# Patient Record
Sex: Female | Born: 1965
Health system: Southern US, Community
[De-identification: ages and names within clinical notes are randomized; demographics above are authoritative.]

## PROBLEM LIST (undated history)

## (undated) DIAGNOSIS — R519 Headache, unspecified: Secondary | ICD-10-CM

## (undated) DIAGNOSIS — M797 Fibromyalgia: Secondary | ICD-10-CM

## (undated) DIAGNOSIS — D219 Benign neoplasm of connective and other soft tissue, unspecified: Secondary | ICD-10-CM

## (undated) DIAGNOSIS — R51 Headache: Secondary | ICD-10-CM

## (undated) HISTORY — PX: ABDOMINAL HYSTERECTOMY: SHX81

---

## 2009-07-03 ENCOUNTER — Emergency Department (HOSPITAL_BASED_OUTPATIENT_CLINIC_OR_DEPARTMENT_OTHER): Admission: EM | Admit: 2009-07-03 | Discharge: 2009-07-04 | Payer: Self-pay | Admitting: Emergency Medicine

## 2011-03-14 LAB — HEMOGLOBIN A1C
Hgb A1c MFr Bld: 5.6 % (ref 4.6–6.1)
Mean Plasma Glucose: 114 mg/dL

## 2015-12-08 ENCOUNTER — Emergency Department (HOSPITAL_BASED_OUTPATIENT_CLINIC_OR_DEPARTMENT_OTHER)
Admission: EM | Admit: 2015-12-08 | Discharge: 2015-12-08 | Disposition: A | Discharge status: Home / Self Care | Payer: BLUE CROSS/BLUE SHIELD | Attending: Emergency Medicine | Admitting: Emergency Medicine

## 2015-12-08 ENCOUNTER — Encounter (HOSPITAL_BASED_OUTPATIENT_CLINIC_OR_DEPARTMENT_OTHER): Payer: Self-pay | Admitting: *Deleted

## 2015-12-08 DIAGNOSIS — Z23 Encounter for immunization: Secondary | ICD-10-CM | POA: Diagnosis not present

## 2015-12-08 DIAGNOSIS — L02211 Cutaneous abscess of abdominal wall: Secondary | ICD-10-CM | POA: Diagnosis present

## 2015-12-08 DIAGNOSIS — L0291 Cutaneous abscess, unspecified: Secondary | ICD-10-CM

## 2015-12-08 DIAGNOSIS — M797 Fibromyalgia: Secondary | ICD-10-CM | POA: Diagnosis not present

## 2015-12-08 DIAGNOSIS — Z86018 Personal history of other benign neoplasm: Secondary | ICD-10-CM | POA: Insufficient documentation

## 2015-12-08 HISTORY — DX: Fibromyalgia: M79.7

## 2015-12-08 HISTORY — DX: Benign neoplasm of connective and other soft tissue, unspecified: D21.9

## 2015-12-08 MED ORDER — LIDOCAINE HCL (PF) 1 % IJ SOLN
5.0000 mL | Freq: Once | INTRAMUSCULAR | Status: AC
  Administered 2015-12-08: 5 mL
  Filled 2015-12-08: qty 5

## 2015-12-08 MED ORDER — SULFAMETHOXAZOLE-TRIMETHOPRIM 800-160 MG PO TABS: 1.0000 | ORAL_TABLET | Freq: Two times a day (BID) | ORAL | Status: AC

## 2015-12-08 MED ORDER — TETANUS-DIPHTH-ACELL PERTUSSIS 5-2.5-18.5 LF-MCG/0.5 IM SUSP
0.5000 mL | Freq: Once | INTRAMUSCULAR | Status: AC
  Administered 2015-12-08: 0.5 mL via INTRAMUSCULAR
  Filled 2015-12-08: qty 0.5

## 2015-12-08 MED ORDER — HYDROCODONE-ACETAMINOPHEN 5-325 MG PO TABS: 1.0000 | ORAL_TABLET | ORAL | Status: DC | PRN

## 2015-12-08 MED FILL — HYDROCODON-APAP 5-325: 5-325 | 2 days supply | Qty: 15 | Fill #0

## 2015-12-08 MED FILL — SULFAMETHOXAZOLE/TMP DS TAB: 800-160 | 10 days supply | Qty: 20 | Fill #0

## 2015-12-08 NOTE — Discharge Instructions (Signed)

## 2015-12-08 NOTE — ED Notes (Signed)
C/o abscess to right breast started several months ago. Feels hot and painful.

## 2015-12-08 NOTE — ED Notes (Signed)
Wound cleaned with NS and 4x4 drsg applied secured with paper tape.

## 2015-12-08 NOTE — ED Provider Notes (Addendum)
CSN: [id number]     Arrival date & time 12/08/15  0800 History   First MD Initiated Contact with Patient 12/08/15 0815     Chief Complaint  Patient presents with  . Abscess     (Consider location/radiation/quality/duration/timing/severity/associated sxs/prior Treatment) HPI Comments: Patient presents with a knot to her upper abdominal wall. She states she noted a small bump that was non-tender about 2 months ago. It's been unchanged since that time. About a week ago she noticed it was getting bigger and more painful. There is no drainage. She denies any fevers. She denies any history of skin abscesses. She has a constant throbbing pain to the area.  Patient is a 50 y.o. female presenting with abscess.  Abscess Associated symptoms: no fever, no headaches, no nausea and no vomiting     Past Medical History  Diagnosis Date  . Fibromyalgia   . Fibroid tumor    Past Surgical History  Procedure Laterality Date  . Abdominal hysterectomy     No family history on file. Social History  Substance Use Topics  . Smoking status: Never Smoker   . Smokeless tobacco: None  . Alcohol Use: None   OB History    No data available     Review of Systems  Constitutional: Negative for fever.  Gastrointestinal: Negative for nausea and vomiting.  Musculoskeletal: Negative for back pain, joint swelling, arthralgias and neck pain.  Skin: Positive for wound.  Neurological: Negative for weakness, numbness and headaches.      Allergies  Review of patient's allergies indicates no known allergies.  Home Medications   Prior to Admission medications   Medication Sig Start Date End Date Taking? Authorizing Provider  amitriptyline (ELAVIL) 10 MG tablet Take 10 mg by mouth at bedtime.   Yes Historical Provider, MD  HYDROcodone-acetaminophen (NORCO/VICODIN) 5-325 MG tablet Take 1-2 tablets by mouth every 4 (four) hours as needed. 12/08/15   Malvin Johns, MD  sulfamethoxazole-trimethoprim (BACTRIM  DS,SEPTRA DS) 800-160 MG tablet Take 1 tablet by mouth 2 (two) times daily. 12/08/15 12/15/15  Malvin Johns, MD   BP 151/91 mmHg  Pulse 74  Temp(Src) 98.8 F (37.1 C) (Oral)  Resp 18  Ht 5\' 7"  (1.702 m)  SpO2 98% Physical Exam  Constitutional: She is oriented to person, place, and time. She appears well-developed and well-nourished.  HENT:  Head: Normocephalic and atraumatic.  Neck: Normal range of motion. Neck supple.  Cardiovascular: Normal rate.   Pulmonary/Chest: Effort normal.  Musculoskeletal: She exhibits edema and tenderness.  Patient has a 2 cm indurated area to her right upper abdominal wall. There is surrounding erythema about 3-4 cm in diameter. No drainage. It is tender to palpation. There seems to be a small central area of fluctuance.  Neurological: She is alert and oriented to person, place, and time.  Skin: Skin is warm and dry.  Psychiatric: She has a normal mood and affect.    ED Course  .Marland KitchenIncision and Drainage Date/Time: 12/08/2015 8:54 AM Performed by: Fatimah Sundquist Authorized by: Malvin Johns Consent: Verbal consent obtained. Risks and benefits: risks, benefits and alternatives were discussed Consent given by: patient Type: abscess Body area: trunk Location details: abdomen Anesthesia: local infiltration Local anesthetic: lidocaine 1% without epinephrine Anesthetic total: 3 ml Patient sedated: no Scalpel size: 11 Incision type: elliptical Incision depth: subcutaneous Complexity: simple Drainage: purulent Drainage amount: moderate Wound treatment: wound left open Patient tolerance: Patient tolerated the procedure well with no immediate complications Comments: Moderate amount of cheesy discharge from  wound   (including critical care time) Labs Review Labs Reviewed - No data to display  Imaging Review No results found. I have personally reviewed and evaluated these images and lab results as part of my medical decision-making.   EKG  Interpretation None      MDM   Final diagnoses:  Abscess    Patient's abscess was opened. Her tetanus shot was updated. She was given a prescription for Vicodin and Bactrim. I feel that this was an infected sebaceous cyst. I advised her that the cyst may recur and gave her referral to [facility] surgery for definitive removal should it recur.  Pt's BP was a little high.  Advised her that she needs to have this rechecked.    Malvin Johns, MD 12/08/15 [id number]  Malvin Johns, MD 12/08/15 [phone]

## 2016-12-15 ENCOUNTER — Emergency Department (HOSPITAL_BASED_OUTPATIENT_CLINIC_OR_DEPARTMENT_OTHER)
Admission: EM | Admit: 2016-12-15 | Discharge: 2016-12-15 | Disposition: A | Discharge status: Home / Self Care | Payer: Self-pay | Attending: Emergency Medicine | Admitting: Emergency Medicine

## 2016-12-15 ENCOUNTER — Encounter (HOSPITAL_BASED_OUTPATIENT_CLINIC_OR_DEPARTMENT_OTHER): Payer: Self-pay

## 2016-12-15 ENCOUNTER — Emergency Department (HOSPITAL_BASED_OUTPATIENT_CLINIC_OR_DEPARTMENT_OTHER): Payer: Self-pay

## 2016-12-15 DIAGNOSIS — J069 Acute upper respiratory infection, unspecified: Secondary | ICD-10-CM | POA: Insufficient documentation

## 2016-12-15 MED ORDER — BENZONATATE 100 MG PO CAPS: 100.0000 mg | ORAL_CAPSULE | Freq: Three times a day (TID) | ORAL | 0 refills | Status: DC

## 2016-12-15 MED ORDER — IBUPROFEN 800 MG PO TABS: 800.0000 mg | ORAL_TABLET | Freq: Three times a day (TID) | ORAL | 0 refills | Status: AC

## 2016-12-15 MED ORDER — IBUPROFEN 800 MG PO TABS
800.0000 mg | ORAL_TABLET | Freq: Once | ORAL | Status: AC
  Administered 2016-12-15: 800 mg via ORAL
  Filled 2016-12-15: qty 1

## 2016-12-15 NOTE — ED Triage Notes (Signed)
C/o body aches, prod cough, sore throat x 5 days-NAD-steady gait-last dose tylenol 4pm

## 2016-12-15 NOTE — Discharge Instructions (Signed)
There were no abnormalities on the chest x-ray. Your symptoms are consistent with a viral illness. Viruses do not require antibiotics. Treatment is symptomatic care and it is important to note that these symptoms may last for 7-10 days. Drink plenty of fluids and get plenty of rest. You should be drinking at least half a liter of water an hour to stay hydrated. Electrolyte drinks are also encouraged. Ibuprofen, Naproxen, or Tylenol for pain or fever. Tessalon for cough. Plain Mucinex may help relieve congestion. Warm liquids or Chloraseptic spray may help soothe a sore throat. Follow up with a primary care provider, as needed, for any future management of this issue.

## 2016-12-15 NOTE — ED Provider Notes (Signed)
[place] [facility] Provider Note   CSN: [id number] Arrival date & time: 12/15/16  [id number]  By signing my name below, I, Kathy Berger, attest that this documentation has been prepared under the direction and in the presence of non-physician practitioner, Arlean Hopping, PA-C. Electronically Signed: Jeanell Berger, Scribe. 12/15/2016. 9:10 PM.  History   Chief Complaint Chief Complaint  Patient presents with  . Generalized Body Aches   The history is provided by the patient. No language interpreter was used.   HPI Comments: Kathy Berger is a 51 y.o. female who presents to the Emergency Department complaining of intermittent moderate productive cough that started about 4 days ago. She reports associated generalized myalgias and subjective fever. She took tylenol for symptoms with some relief. She denies N/V/D, chest pain, shortness of breath, or any other complaints.    Past Medical History:  Diagnosis Date  . Fibroid tumor   . Fibromyalgia     There are no active problems to display for this patient.   Past Surgical History:  Procedure Laterality Date  . ABDOMINAL HYSTERECTOMY      OB History    No data available       Home Medications    Prior to Admission medications   Medication Sig Start Date End Date Taking? Authorizing Provider  benzonatate (TESSALON) 100 MG capsule Take 1 capsule (100 mg total) by mouth every 8 (eight) hours. 12/15/16   Warren Kugelman C Feras Gardella, PA-C  ibuprofen (ADVIL,MOTRIN) 800 MG tablet Take 1 tablet (800 mg total) by mouth 3 (three) times daily. 12/15/16   Lorayne Bender, PA-C    Family History No family history on file.  Social History Social History  Substance Use Topics  . Smoking status: Never Smoker  . Smokeless tobacco: Never Used  . Alcohol use No     Allergies   Patient has no known allergies.   Review of Systems Review of Systems  Constitutional: Positive for fever (Subjective).  Respiratory: Positive for cough. Negative for shortness of  breath.   Cardiovascular: Negative for chest pain.  Gastrointestinal: Negative for abdominal pain, diarrhea, nausea and vomiting.  Skin: Negative for rash.  All other systems reviewed and are negative.    Physical Exam Updated Vital Signs BP 142/80 (BP Location: Left Arm)   Pulse 75   Temp 98.9 F (37.2 C) (Oral)   Resp 20   SpO2 100%   Physical Exam  Constitutional: She appears well-developed and well-nourished. No distress.  HENT:  Head: Normocephalic and atraumatic.  Eyes: Conjunctivae are normal.  Neck: Neck supple.  Cardiovascular: Normal rate, regular rhythm, normal heart sounds and intact distal pulses.   Pulmonary/Chest: Effort normal and breath sounds normal. No respiratory distress.  Abdominal: Soft. There is no tenderness. There is no guarding.  Musculoskeletal: She exhibits no edema.  Lymphadenopathy:    She has no cervical adenopathy.  Neurological: She is alert.  Skin: Skin is warm and dry. She is not diaphoretic.  Psychiatric: She has a normal mood and affect. Her behavior is normal.  Nursing note and vitals reviewed.    ED Treatments / Results  DIAGNOSTIC STUDIES: Oxygen Saturation is 100% on RA, normal by my interpretation.    COORDINATION OF CARE: 9:14 PM- Pt advised of plan for treatment and pt agrees.  Labs (all labs ordered are listed, but only abnormal results are displayed) Labs Reviewed - No data to display  EKG  EKG Interpretation None       Radiology EXAM:  CHEST  2 VIEW    COMPARISON: None.    FINDINGS:  Shallow inspiration with mild linear atelectasis in the lung bases.  Normal heart size and pulmonary vascularity. No focal airspace  disease or consolidation in the lungs. No blunting of costophrenic  angles. No pneumothorax. Mediastinal contours appear intact.    IMPRESSION:  Shallow inspiration with mild linear atelectasis in the lung bases.  No focal consolidation.      Electronically Signed  By: Lucienne Capers M.D.  On: 12/15/2016 21:33     Procedures Procedures (including critical care time)  Medications Ordered in ED Medications  ibuprofen (ADVIL,MOTRIN) tablet 800 mg (800 mg Oral Given 12/15/16 1905)     Initial Impression / Assessment and Plan / ED Course  I have reviewed the triage vital signs and the nursing notes.  Pertinent labs & imaging results that were available during my care of the patient were reviewed by me and considered in my medical decision making (see chart for details).  Clinical Course     Patient presents with cough and URI symptoms. Negative chest x-ray. Home care and return precautions discussed.  Vitals:   12/15/16 1900 12/15/16 2015 12/15/16 2103  BP: 142/80  123/76  Pulse: 89  75  Resp: 20  18  Temp: 101.1 F (38.4 C) 100.1 F (37.8 C) 98.9 F (37.2 C)  TempSrc: Oral  Oral  SpO2: 100%  99%     Final Clinical Impressions(s) / ED Diagnoses   Final diagnoses:  Upper respiratory tract infection, unspecified type    New Prescriptions Discharge Medication List as of 12/15/2016  9:15 PM    START taking these medications   Details  benzonatate (TESSALON) 100 MG capsule Take 1 capsule (100 mg total) by mouth every 8 (eight) hours., Starting Wed 12/15/2016, Print    ibuprofen (ADVIL,MOTRIN) 800 MG tablet Take 1 tablet (800 mg total) by mouth 3 (three) times daily., Starting Wed 12/15/2016, Print       I personally performed the services described in this documentation, which was scribed in my presence. The recorded information has been reviewed and is accurate.    Lorayne Bender, PA-C 12/18/16 [id number]    Merrily Pew, MD 12/20/16 [phone]

## 2016-12-15 NOTE — ED Notes (Signed)
Patient transported to X-ray 

## 2017-09-02 ENCOUNTER — Telehealth: Payer: Self-pay | Admitting: *Deleted

## 2017-09-02 NOTE — Telephone Encounter (Signed)
Received request for Medical records from [place], forwarded to [place] for email/scan/SLS 09/28

## 2018-01-17 ENCOUNTER — Other Ambulatory Visit: Payer: Self-pay

## 2018-01-17 ENCOUNTER — Emergency Department (HOSPITAL_BASED_OUTPATIENT_CLINIC_OR_DEPARTMENT_OTHER)
Admission: EM | Admit: 2018-01-17 | Discharge: 2018-01-17 | Disposition: A | Discharge status: Home / Self Care | Payer: BLUE CROSS/BLUE SHIELD | Attending: Emergency Medicine | Admitting: Emergency Medicine

## 2018-01-17 ENCOUNTER — Encounter (HOSPITAL_BASED_OUTPATIENT_CLINIC_OR_DEPARTMENT_OTHER): Payer: Self-pay | Admitting: Emergency Medicine

## 2018-01-17 DIAGNOSIS — R51 Headache: Secondary | ICD-10-CM | POA: Insufficient documentation

## 2018-01-17 DIAGNOSIS — I10 Essential (primary) hypertension: Secondary | ICD-10-CM | POA: Insufficient documentation

## 2018-01-17 DIAGNOSIS — R519 Headache, unspecified: Secondary | ICD-10-CM

## 2018-01-17 HISTORY — DX: Headache: R51

## 2018-01-17 HISTORY — DX: Headache, unspecified: R51.9

## 2018-01-17 MED ORDER — IBUPROFEN 400 MG PO TABS
600.0000 mg | ORAL_TABLET | Freq: Once | ORAL | Status: AC
  Administered 2018-01-17: 600 mg via ORAL
  Filled 2018-01-17: qty 1

## 2018-01-17 NOTE — ED Triage Notes (Signed)
Pt has headache for 3 days.  Pt states she has a lump on the back of her head since childhood and believes it is related.  Pt also states she has generalized body aches.  No fever.  No nausea.  No photo sensitivity.

## 2018-01-17 NOTE — Discharge Instructions (Signed)
If you develop new or worsening symptoms such as a more severe headache, vomiting, blurry vision, or dizziness then return to the ER for evaluation.  Otherwise follow-up with your primary care doctor for these on and off headaches.  Your blood pressure was elevated today and you need to follow-up with your primary care doctor to get this rechecked.

## 2018-01-17 NOTE — ED Provider Notes (Signed)
[name]   CSN: [id number] Arrival date & time: 01/17/18  0700     History   Chief Complaint Chief Complaint  Patient presents with  . Headache    HPI Kathy Berger is a 52 y.o. female.  HPI 52 year old female with a history of fibromyalgia and a history of recurrent left-sided headaches presents with a left-sided headache.  Started on 2/9.  Has been coming and going since. Patient states that the headache feels like it starts behind her left ear and radiates to the front.  Also radiates into her neck.  No blurry vision, photophobia, neck stiffness, vomiting, or weakness/numbness.  She took some Motrin yesterday but otherwise has not taken much.  Since today has also been feeling diffuse pain throughout her body consistent with prior flares from fibromyalgia.  She has not had any fevers during this time.  No sore throat or other pain.  She states she was diagnosed with a lump behind her left ear when she was a child and was told to watch it but otherwise she has had around that area.  No ear pain.  Past Medical History:  Diagnosis Date  . Fibroid tumor   . Fibromyalgia   . Headache     There are no active problems to display for this patient.   Past Surgical History:  Procedure Laterality Date  . ABDOMINAL HYSTERECTOMY      OB History    No data available       Home Medications    Prior to Admission medications   Medication Sig Start Date End Date Taking? Authorizing Provider  ibuprofen (ADVIL,MOTRIN) 800 MG tablet Take 1 tablet (800 mg total) by mouth 3 (three) times daily. 12/15/16   Joy, Helane Gunther, PA-C    Family History No family history on file.  Social History Social History   Tobacco Use  . Smoking status: Never Smoker  . Smokeless tobacco: Never Used  Substance Use Topics  . Alcohol use: No  . Drug use: No     Allergies   Patient has no known allergies.   Review of Systems Review of Systems    Constitutional: Negative for fever.  HENT: Negative for ear pain.   Eyes: Negative for photophobia and visual disturbance.  Gastrointestinal: Negative for nausea and vomiting.  Musculoskeletal: Negative for neck stiffness.  Neurological: Positive for headaches. Negative for dizziness, weakness and numbness.  All other systems reviewed and are negative.    Physical Exam Updated Vital Signs BP (!) 164/94 (BP Location: Left Arm)   Pulse 69   Temp 99.1 F (37.3 C) (Oral)   Resp 16   Ht 5' 7.5" (1.715 m)   Wt 93.8 kg (206 lb 12.7 oz)   SpO2 97%   BMI 31.91 kg/m   Physical Exam  Constitutional: She is oriented to person, place, and time. She appears well-developed and well-nourished.  Non-toxic appearance. She does not appear ill. No distress.  HENT:  Head: Normocephalic and atraumatic.  Right Ear: Tympanic membrane, external ear and ear canal normal. No tenderness.  Left Ear: Tympanic membrane, external ear and ear canal normal. No tenderness. No mastoid tenderness.  Nose: Nose normal.  Mouth/Throat: Oropharynx is clear and moist.  No tenderness to scalp. No swelling or skin changes  Eyes: EOM are normal. Pupils are equal, round, and reactive to light. Right eye exhibits no discharge. Left eye exhibits no discharge.  Neck: Normal range of motion. Neck supple. No neck  rigidity.  Cardiovascular: Normal rate, regular rhythm and normal heart sounds.  Pulmonary/Chest: Effort normal and breath sounds normal.  Abdominal: She exhibits no distension.  Neurological: She is alert and oriented to person, place, and time.  CN 3-12 grossly intact. 5/5 strength in all 4 extremities. Grossly normal sensation. Normal finger to nose.   Skin: Skin is warm and dry.  Nursing note and vitals reviewed.    ED Treatments / Results  Labs (all labs ordered are listed, but only abnormal results are displayed) Labs Reviewed - No data to display  EKG  EKG Interpretation None        Radiology No results found.  Procedures Procedures (including critical care time)  Medications Ordered in ED Medications  ibuprofen (ADVIL,MOTRIN) tablet 600 mg (600 mg Oral Given 01/17/18 0729)     Initial Impression / Assessment and Plan / ED Course  I have reviewed the triage vital signs and the nursing notes.  Pertinent labs & imaging results that were available during my care of the patient were reviewed by me and considered in my medical decision making (see chart for details).     Patient is hypertensive here, unclear if this is pathologic or from pain.  However she does not appear in much distress.  Offered IM medication for her headache but she declines.  Her headache is likely benign, especially with no other concerning findings or symptoms such as fevers, vomiting, photophobia/blurry vision or weakness/numbness.  She overall appears well.  Advised NSAIDs and follow-up with PCP.  No indication for emergent imaging at this time.  My suspicion for serious CNS pathology such as infection, bleed, stroke, etc. is low.  Final Clinical Impressions(s) / ED Diagnoses   Final diagnoses:  Left-sided headache  Hypertension, unspecified type    ED Discharge Orders    None       Sherwood Gambler, MD 01/17/18 [phone]

## 2019-02-17 ENCOUNTER — Emergency Department (HOSPITAL_BASED_OUTPATIENT_CLINIC_OR_DEPARTMENT_OTHER): Payer: BLUE CROSS/BLUE SHIELD

## 2019-02-17 ENCOUNTER — Emergency Department (HOSPITAL_BASED_OUTPATIENT_CLINIC_OR_DEPARTMENT_OTHER)
Admission: EM | Admit: 2019-02-17 | Discharge: 2019-02-17 | Disposition: A | Discharge status: Home / Self Care | Payer: BLUE CROSS/BLUE SHIELD | Attending: Emergency Medicine | Admitting: Emergency Medicine

## 2019-02-17 ENCOUNTER — Other Ambulatory Visit: Payer: Self-pay

## 2019-02-17 DIAGNOSIS — M25512 Pain in left shoulder: Secondary | ICD-10-CM | POA: Insufficient documentation

## 2019-02-17 DIAGNOSIS — R0789 Other chest pain: Secondary | ICD-10-CM | POA: Insufficient documentation

## 2019-02-17 DIAGNOSIS — Z79899 Other long term (current) drug therapy: Secondary | ICD-10-CM | POA: Insufficient documentation

## 2019-02-17 LAB — BASIC METABOLIC PANEL
Anion gap: 5 (ref 5–15)
BUN: 10 mg/dL (ref 6–20)
CO2: 26 mmol/L (ref 22–32)
Calcium: 8.9 mg/dL (ref 8.9–10.3)
Chloride: 106 mmol/L (ref 98–111)
Creatinine, Ser: 0.79 mg/dL (ref 0.44–1.00)
GFR calc Af Amer: >60 mL/min (ref >60)
GFR calc non Af Amer: >60 mL/min (ref >60)
GLUCOSE: 143 mg/dL — AB (ref 70–99)
POTASSIUM: 3 mmol/L — AB (ref 3.5–5.1)
Sodium: 137 mmol/L (ref 135–145)

## 2019-02-17 LAB — CBC
HEMATOCRIT: 38.6 % (ref 36.0–46.0)
Hemoglobin: 11.8 g/dL — ABNORMAL LOW (ref 12.0–15.0)
MCH: 22.7 pg — ABNORMAL LOW (ref 26.0–34.0)
MCHC: 30.6 g/dL (ref 30.0–36.0)
MCV: 74.2 fL — ABNORMAL LOW (ref 80.0–100.0)
Platelets: 323 10*3/uL (ref 150–400)
RBC: 5.2 MIL/uL — ABNORMAL HIGH (ref 3.87–5.11)
RDW: 14.8 % (ref 11.5–15.5)
WBC: 10.6 10*3/uL — AB (ref 4.0–10.5)
nRBC: 0 % (ref 0.0–0.2)

## 2019-02-17 LAB — TROPONIN I: Troponin I: <0.03 ng/mL (ref <0.03)

## 2019-02-17 MED ORDER — ASPIRIN 81 MG PO CHEW
324.0000 mg | CHEWABLE_TABLET | Freq: Once | ORAL | Status: AC
  Administered 2019-02-17: 324 mg via ORAL
  Filled 2019-02-17: qty 4

## 2019-02-17 MED ORDER — SODIUM CHLORIDE 0.9% FLUSH
3.0000 mL | Freq: Once | INTRAVENOUS | Status: DC
  Filled 2019-02-17: qty 3

## 2019-02-17 NOTE — ED Provider Notes (Signed)
[place] EMERGENCY DEPARTMENT Provider Note   CSN: [id number] Arrival date & time: 02/17/19  1406    History   Chief Complaint Chief Complaint  Patient presents with  . Chest Pain    HPI Kathy Berger is a 53 y.o. female with past medical history of fibromyalgia, presenting to the emergency department with complaint of left-sided shoulder and chest pain that began yesterday evening.  Patient states she was sitting in a chair when the pain began.  It felt like a tightness to her left shoulder radiated into her neck.  Pain is constant with some associated nausea and dizziness.  The pain is now wrapped around her chest and is to the left chest.  Pain is worse with moving her left arm and palpation.  She treated her symptoms with ibuprofen around 3 AM with moderate relief.  No shortness of breath, diaphoresis, abdominal pain, indigestion, leg swelling or pain.  No cardiac history.  No history of DVT/PE.     The history is provided by the patient.    Past Medical History:  Diagnosis Date  . Fibroid tumor   . Fibromyalgia   . Headache     There are no active problems to display for this patient.   Past Surgical History:  Procedure Laterality Date  . ABDOMINAL HYSTERECTOMY       OB History   No obstetric history on file.      Home Medications    Prior to Admission medications   Medication Sig Start Date End Date Taking? Authorizing Provider  ibuprofen (ADVIL,MOTRIN) 800 MG tablet Take 1 tablet (800 mg total) by mouth 3 (three) times daily. 12/15/16   Joy, Helane Gunther, PA-C    Family History No family history on file.  Social History Social History   Tobacco Use  . Smoking status: Never Smoker  . Smokeless tobacco: Never Used  Substance Use Topics  . Alcohol use: No  . Drug use: No     Allergies   Patient has no known allergies.   Review of Systems Review of Systems  Constitutional: Negative for diaphoresis.  Respiratory: Negative for cough and  shortness of breath.   Cardiovascular: Positive for chest pain. Negative for palpitations and leg swelling.  Gastrointestinal: Positive for nausea. Negative for abdominal pain and vomiting.  Musculoskeletal: Positive for back pain.  All other systems reviewed and are negative.    Physical Exam Updated Vital Signs BP 139/81   Pulse 73   Temp 98.9 F (37.2 C) (Oral)   Resp 18   Ht 5\' 7"  (1.702 m)   Wt 93.4 kg   SpO2 99%   BMI 32.26 kg/m   Physical Exam Vitals signs and nursing note reviewed.  Constitutional:      General: She is not in acute distress.    Appearance: She is well-developed. She is not ill-appearing.  HENT:     Head: Normocephalic and atraumatic.  Eyes:     Conjunctiva/sclera: Conjunctivae normal.  Neck:     Musculoskeletal: Normal range of motion and neck supple.  Cardiovascular:     Rate and Rhythm: Normal rate and regular rhythm.     Heart sounds: Normal heart sounds.  Pulmonary:     Effort: Pulmonary effort is normal. No respiratory distress.     Breath sounds: Normal breath sounds.  Chest:     Chest wall: Tenderness present.  Abdominal:     General: Bowel sounds are normal. There is no distension.  Palpations: Abdomen is soft. There is no mass.     Tenderness: There is no abdominal tenderness. There is no guarding or rebound.  Musculoskeletal:     Right lower leg: No edema.     Left lower leg: No edema.     Comments: There is tenderness to the left anterior chest wall and left upper back medial to the scapula.  Pain with movement of the left arm.  Patient states the pain produced on this exam is the exact same pain she has been feeling all night.  Skin:    General: Skin is warm.  Neurological:     Mental Status: She is alert.  Psychiatric:        Behavior: Behavior normal.      ED Treatments / Results  Labs (all labs ordered are listed, but only abnormal results are displayed) Labs Reviewed  BASIC METABOLIC PANEL - Abnormal; Notable  for the following components:      Result Value   Potassium 3.0 (*)    Glucose, Bld 143 (*)    All other components within normal limits  CBC - Abnormal; Notable for the following components:   WBC 10.6 (*)    RBC 5.20 (*)    Hemoglobin 11.8 (*)    MCV 74.2 (*)    MCH 22.7 (*)    All other components within normal limits  TROPONIN I    EKG None EKG Interpretation  Date/Time: 02/17/2019 @ 14:17   Ventricular Rate:   76bpm PR Interval:   178ms QRS Duration:  1ms QTC Calculation:  421ms Text Interpretation:  normal sinus rhythm  Radiology Dg Chest 2 View  Result Date: 02/17/2019 CLINICAL DATA:  Chest pain EXAM: CHEST - 2 VIEW COMPARISON:  December 15, 2016 FINDINGS: There is slight atelectasis in the left base. The lungs elsewhere are clear. Heart size and pulmonary vascularity are normal. No adenopathy. There is midthoracic dextroscoliosis. IMPRESSION: Lateral left base atelectasis. Lungs elsewhere clear. No adenopathy. Electronically Signed   By: Lowella Grip III M.D.   On: 02/17/2019 15:27    Procedures Procedures (including critical care time)  Medications Ordered in ED Medications  sodium chloride flush (NS) 0.9 % injection 3 mL (has no administration in time range)  aspirin chewable tablet 324 mg (324 mg Oral Given 02/17/19 1541)     Initial Impression / Assessment and Plan / ED Course  I have reviewed the triage vital signs and the nursing notes.  Pertinent labs & imaging results that were available during my care of the patient were reviewed by me and considered in my medical decision making (see chart for details).        Pt presenting with constant left shoulder and chest wall pain, worse with movement of left arm, that began yesterday evening while at rest. Chest pain is reproducible on exam. Chest pain is not likely of cardiac or pulmonary etiology d/t presentation, low risk Wells, VSS, no tracheal deviation, no JVD or new murmur, RRR, breath sounds  equal bilaterally, EKG without acute abnormalities, negative troponin, and negative CXR. Patient is to be discharged with recommendation to follow up with PCP in regards to today's hospital visit.  Suspect symptoms are musculoskeletal in nature.  Recommend symptomatic management.  Pt has been advised to return to the ED if CP becomes exertional, associated with diaphoresis or nausea, radiates to left jaw/arm, worsens or becomes concerning in any way. Pt appears reliable for follow up and is agreeable to discharge.  Patient discussed with Dr. Tamera Punt, who agrees with plan.  Discussed results, findings, treatment and follow up. Patient advised of return precautions. Patient verbalized understanding and agreed with plan.  Final Clinical Impressions(s) / ED Diagnoses   Final diagnoses:  Chest wall pain  Acute pain of left shoulder    ED Discharge Orders    None       Merril Nagy, [place] N, PA-C 02/17/19 1723    Malvin Johns, MD 02/17/19 2321

## 2019-02-17 NOTE — Discharge Instructions (Addendum)
Please read instructions below.  You can take ibuprofen every 6 hours as needed for pain. Return to the ER for new or worsening symptoms; including worsening chest pain, shortness of breath, pain that radiates to the arm or neck, pain or shortness of breath worsened with exertion.  Follow up with your primary care provider.

## 2019-02-17 NOTE — ED Notes (Signed)
L upper CP and posterior shoulder pain, pain is reproducible to palpation, movement, and deep inspiration. Pt has no SHOB or diaphoresis. Pt does report dizziness and nausea.

## 2019-02-17 NOTE — ED Triage Notes (Signed)
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 Associated with dizziness, chest pressure and nausea. Her BP is 92/53, she reports that is very low for her.

## 2019-12-16 IMAGING — DX CHEST - 2 VIEW
2 series · 2 of 2 positions shown · non-contrast
Comparison: December 15, 2016

CLINICAL DATA: Chest pain

EXAM:
CHEST - 2 VIEW

[chest pa]
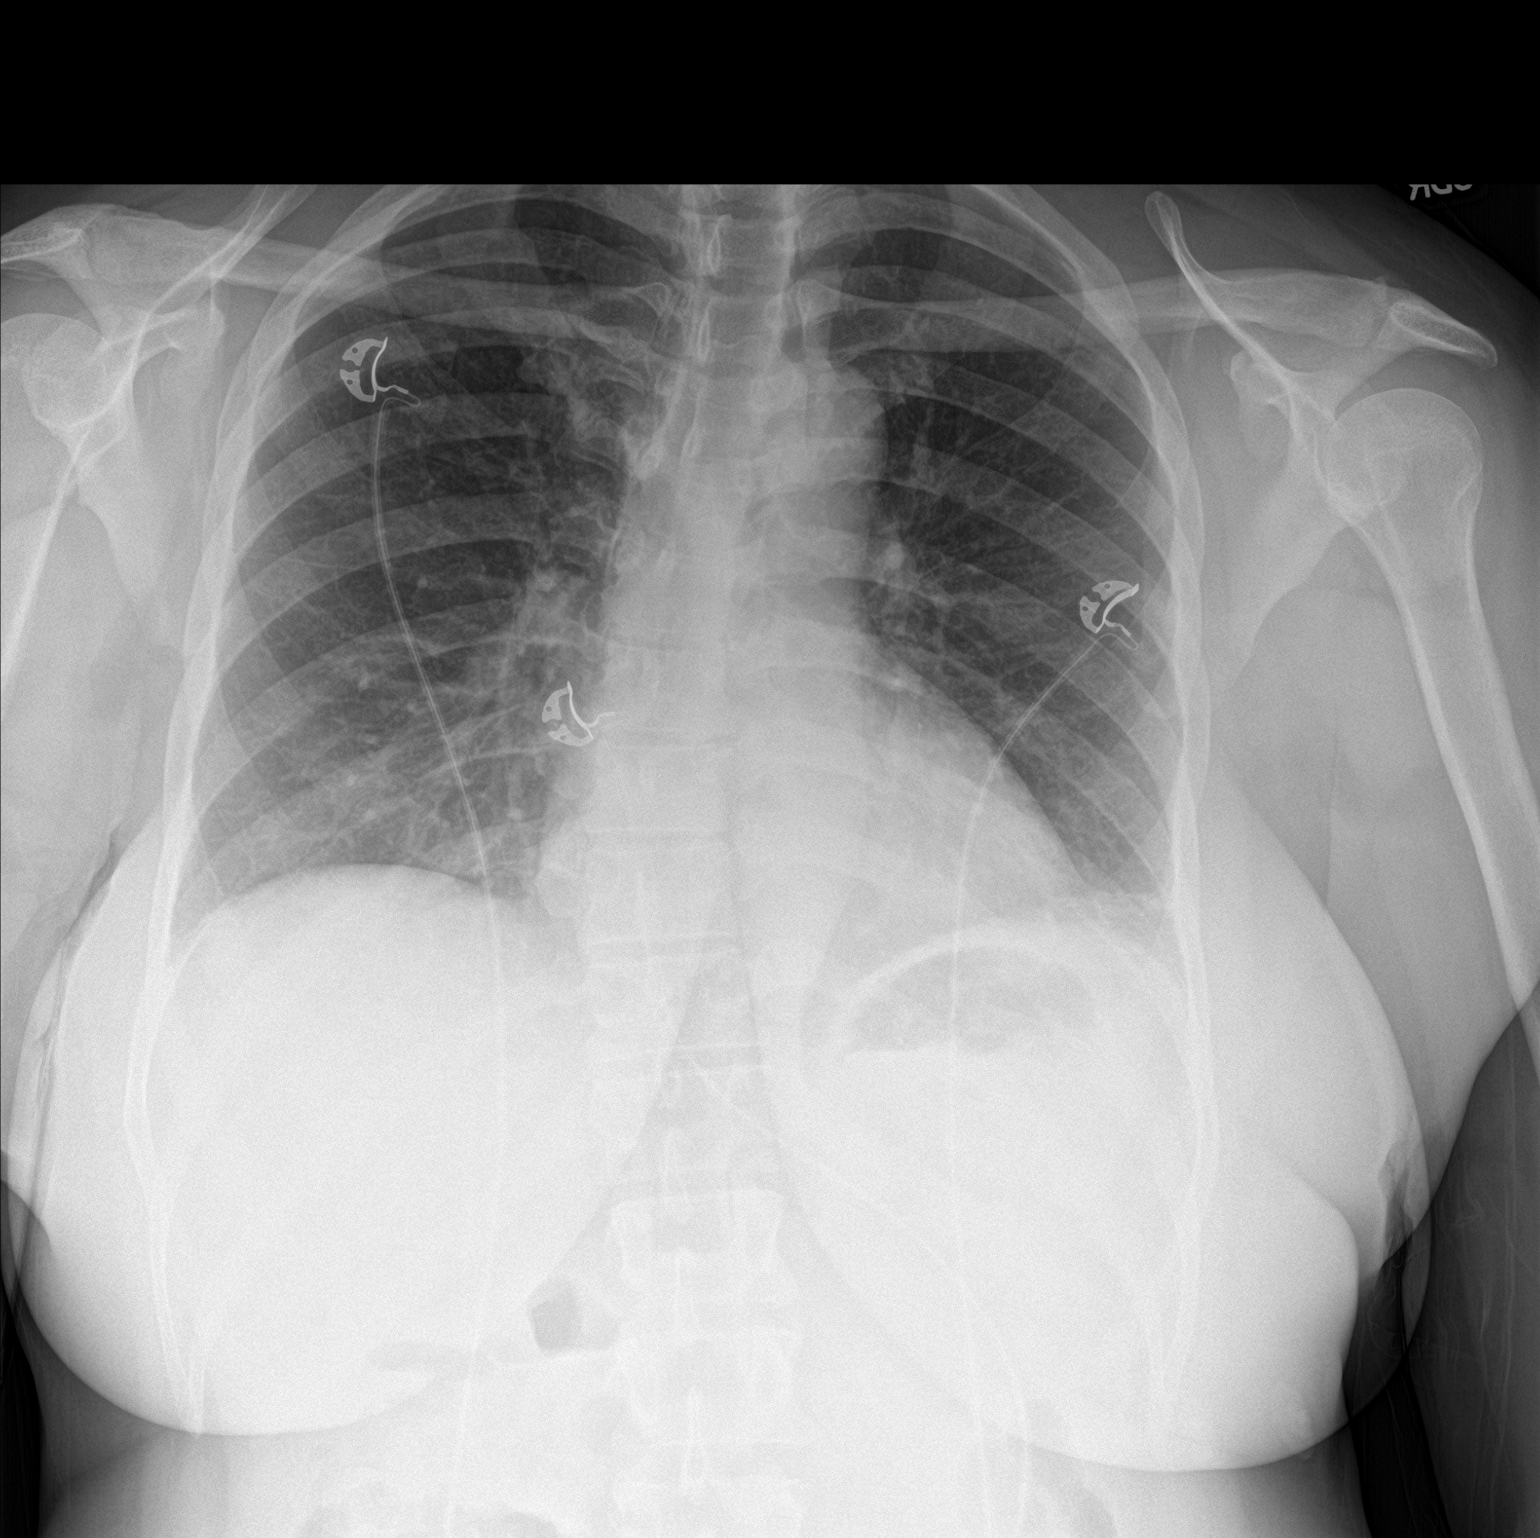

[chest lat]
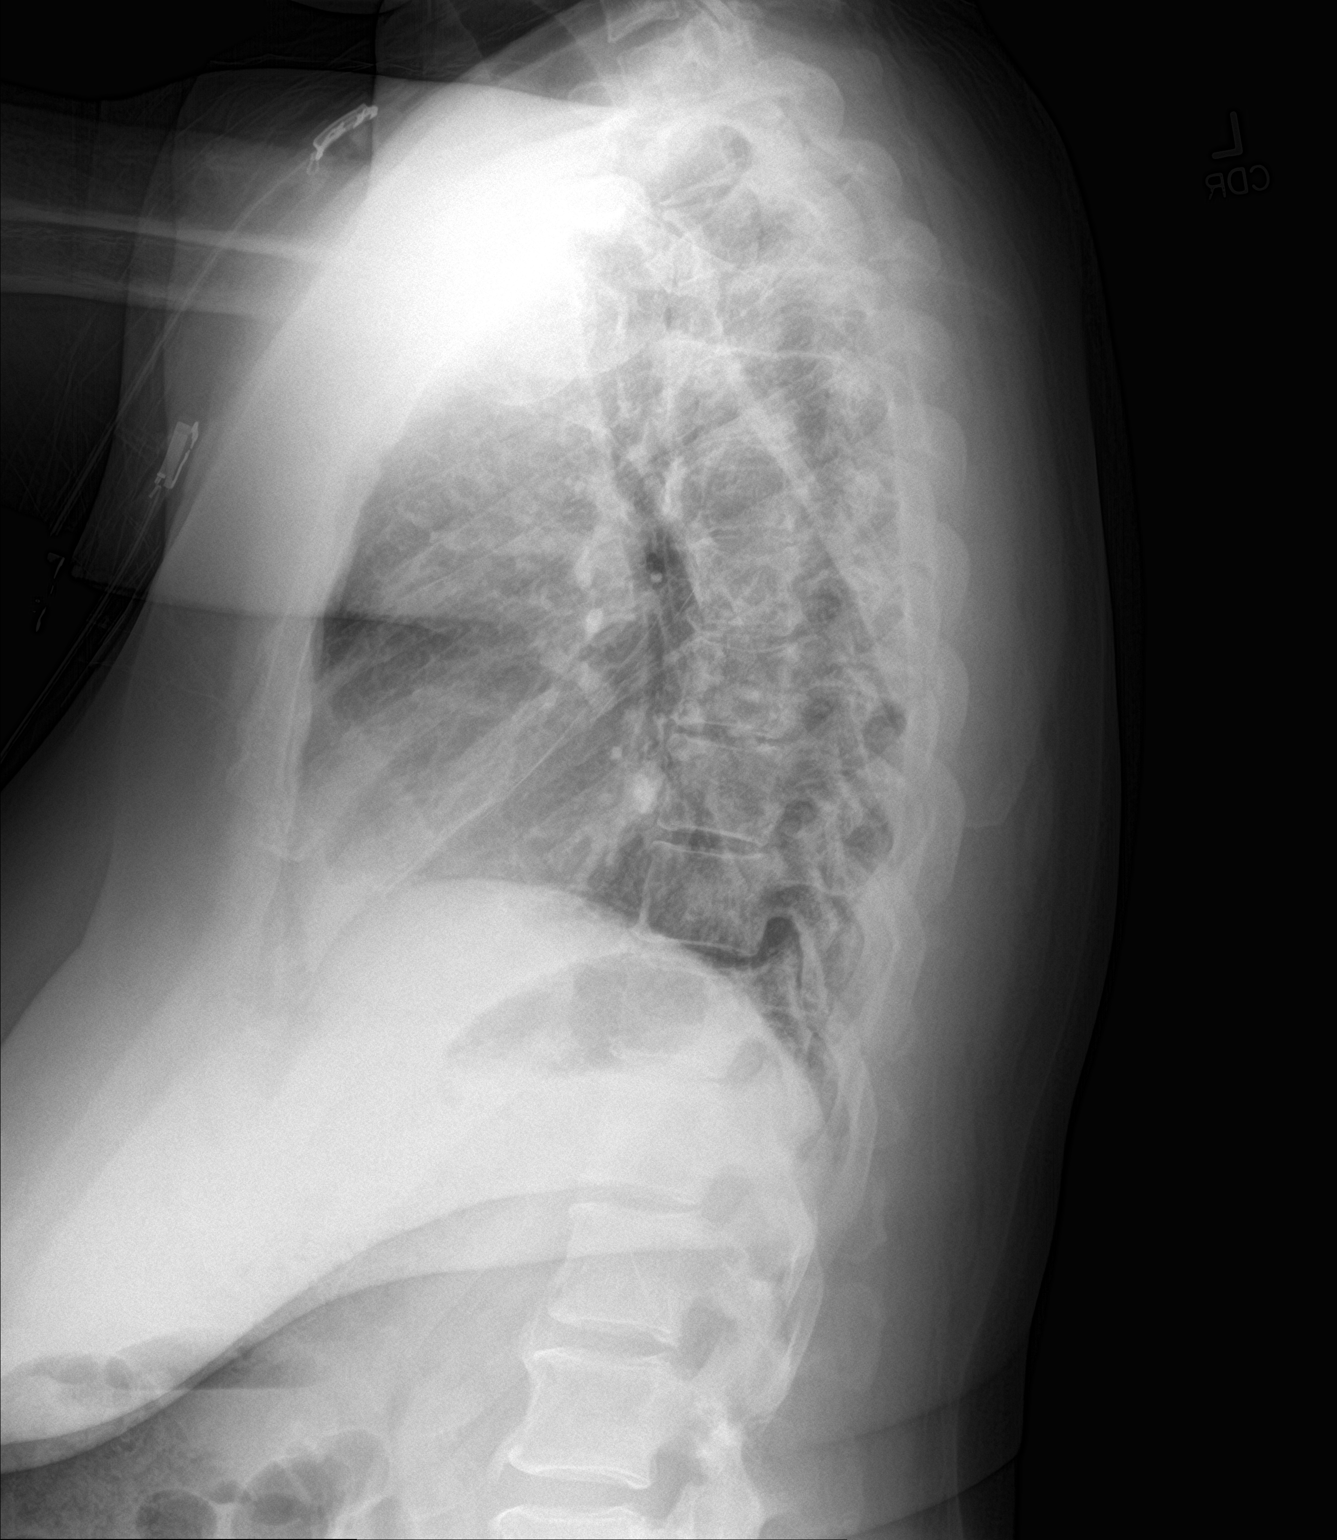

[2 of 2 positions shown; findings below may reference images not displayed]

FINDINGS: There is slight atelectasis in the left base. The lungs elsewhere
are clear. Heart size and pulmonary vascularity are normal. No
adenopathy. There is midthoracic dextroscoliosis.
IMPRESSION: Lateral left base atelectasis. Lungs elsewhere clear. No adenopathy.
